# Patient Record
Sex: Female | Born: 1995 | Race: Black or African American | Hispanic: No | Marital: Single | Smoking: Never smoker
Health system: Southern US, Community
[De-identification: ages and names within clinical notes are randomized; demographics above are authoritative.]

---
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
## 2016-01-01 ENCOUNTER — Telehealth: Payer: Self-pay | Admitting: Behavioral Health

## 2016-01-01 NOTE — Telephone Encounter (Signed)
Unable to reach patient at time of Pre-Visit Call.  Left message for patient to return call when available.    

## 2016-01-04 ENCOUNTER — Other Ambulatory Visit: Payer: Self-pay | Admitting: Family

## 2016-01-04 ENCOUNTER — Telehealth: Payer: Self-pay | Admitting: Family

## 2016-01-04 ENCOUNTER — Encounter: Payer: Self-pay | Admitting: Family

## 2016-01-04 ENCOUNTER — Ambulatory Visit (INDEPENDENT_AMBULATORY_CARE_PROVIDER_SITE_OTHER): Payer: BLUE CROSS/BLUE SHIELD | Admitting: Family

## 2016-01-04 VITALS — BP 112/74 | HR 71 | Temp 98.0°F | Resp 16 | Ht 65.5 in | Wt 128.2 lb

## 2016-01-04 DIAGNOSIS — Z0000 Encounter for general adult medical examination without abnormal findings: Secondary | ICD-10-CM | POA: Diagnosis not present

## 2016-01-04 DIAGNOSIS — Z23 Encounter for immunization: Secondary | ICD-10-CM

## 2016-01-04 LAB — CBC WITH DIFFERENTIAL/PLATELET
Basophils Absolute: 0 10*3/uL (ref 0.0–0.1)
Basophils Relative: 0.4 % (ref 0.0–3.0)
Eosinophils Absolute: 0 10*3/uL (ref 0.0–0.7)
Eosinophils Relative: 0.4 % (ref 0.0–5.0)
HCT: 40 % (ref 36.0–49.0)
Hemoglobin: 13.2 g/dL (ref 12.0–16.0)
Lymphocytes Relative: 35.7 % (ref 24.0–48.0)
Lymphs Abs: 1.7 10*3/uL (ref 0.7–4.0)
MCHC: 33 g/dL (ref 31.0–37.0)
MCV: 92.7 fl (ref 78.0–98.0)
Monocytes Absolute: 0.3 10*3/uL (ref 0.1–1.0)
Monocytes Relative: 6.9 % (ref 3.0–12.0)
Neutro Abs: 2.7 10*3/uL (ref 1.4–7.7)
Neutrophils Relative %: 56.6 % (ref 43.0–71.0)
Platelets: 268 10*3/uL (ref 150.0–575.0)
RBC: 4.31 Mil/uL (ref 3.80–5.70)
RDW: 13.3 % (ref 11.4–15.5)
WBC: 4.8 10*3/uL (ref 4.5–13.5)

## 2016-01-04 LAB — BASIC METABOLIC PANEL
BUN: 14 mg/dL (ref 6–23)
CO2: 27 mEq/L (ref 19–32)
Calcium: 9.4 mg/dL (ref 8.4–10.5)
Chloride: 104 mEq/L (ref 96–112)
Creatinine, Ser: 0.66 mg/dL (ref 0.40–1.20)
GFR: 149.66 mL/min (ref >60.00)
Glucose, Bld: 81 mg/dL (ref 70–99)
Potassium: 4 mEq/L (ref 3.5–5.1)
Sodium: 136 mEq/L (ref 135–145)

## 2016-01-04 LAB — LIPID PANEL
Cholesterol: 112 mg/dL (ref 0–200)
HDL: 41.9 mg/dL (ref >39.00)
LDL Cholesterol: 62 mg/dL (ref 0–99)
NonHDL: 70.34
Total CHOL/HDL Ratio: 3
Triglycerides: 42 mg/dL (ref 0.0–149.0)
VLDL: 8.4 mg/dL (ref 0.0–40.0)

## 2016-01-04 MED ORDER — ETONOGESTREL 68 MG ~~LOC~~ IMPL: 1.0000 | DRUG_IMPLANT | Freq: Once | SUBCUTANEOUS | Status: AC

## 2016-01-04 NOTE — Addendum Note (Signed)
Addended by: Mervin Kung A on: 01/04/2016 06:19 PM   Modules accepted: Orders

## 2016-01-04 NOTE — Patient Instructions (Signed)
Please schedule follow up with GYN. Complete lab work prior to follow up. Schedule a nurse visit in 1 month for additional vaccines. Welcome to [facility]!

## 2016-01-04 NOTE — Telephone Encounter (Signed)
On follow up nurse visit- 1 month (around 02/01/16), patient will need the following vaccines:  Varicella #2 HPV #2 Menactra   On follow up Nurse visit in 6 months, around 07/03/16,  patient will need the following vaccines: Hep A # 2 HPV #3

## 2016-01-04 NOTE — Assessment & Plan Note (Signed)
Discussed healthy diet, exercise safe sex.  She is due for multiple vaccines.  Will split up of the next few visits. See phone note 01/04/16 for details.

## 2016-01-04 NOTE — Progress Notes (Signed)
Subjective:    Patient ID: Alisha Ramirez, female    DOB: 22-Oct-1996, 20 y.o.   MRN: [id number]  HPI  Alisha Ramirez is an 20 yr old female who presents today to establish care.  Immunizations: unsure Diet: diet is healthy Exercise: not exercising Pap Smear: to start at 21 Implanon, condoms placed 9/15- she is due for follow up with GYN.  LMP 1/12    Review of Systems  Constitutional: Negative for unexpected weight change.  HENT: Negative for hearing loss and rhinorrhea.   Eyes: Negative for visual disturbance.  Respiratory: Negative for cough and shortness of breath.   Cardiovascular: Negative for chest pain.  Gastrointestinal: Negative for diarrhea and constipation.  Genitourinary: Negative for dysuria, frequency and menstrual problem.  Musculoskeletal: Negative for myalgias and arthralgias.  Skin: Negative for rash.  Neurological: Negative for headaches.  Hematological: Negative for adenopathy.  Psychiatric/Behavioral:       Denies depression/anxiety   History reviewed. No pertinent past medical history.  Social History   Social History  . Marital Status: Single    Spouse Name: N/A  . Number of Children: N/A  . Years of Education: N/A   Occupational History  . Not on file.   Social History Main Topics  . Smoking status: Never Smoker   . Smokeless tobacco: Never Used  . Alcohol Use: No  . Drug Use: No  . Sexual Activity: Not on file   Other Topics Concern  . Not on file   Social History Narrative   Lives with family   Student- senior, plans to join the [employer].   2 brothers   1 sister   Enjoys work, spending time with friends   Works at [employer].     History reviewed. No pertinent past surgical history.  Family History  Problem Relation Age of Onset  . Hypertension Mother   . Cancer Neg Hx   . Diabetes Neg Hx   . Kidney disease Neg Hx     No Known Allergies  No current outpatient prescriptions on file prior to visit.   No current  facility-administered medications on file prior to visit.    BP 112/74 mmHg  Pulse 71  Temp(Src) 98 F (36.7 C) (Oral)  Resp 16  Ht 5' 5.5" (1.664 m)  Wt 128 lb 3.2 oz (58.151 kg)  BMI 21.00 kg/m2  SpO2 100%  LMP 12/10/2015       Objective:   Physical Exam  Physical Exam  Constitutional: She is oriented to person, place, and time. She appears well-developed and well-nourished. No distress.  HENT:  Head: Normocephalic and atraumatic.  Right Ear: Tympanic membrane and ear canal normal.  Left Ear: Tympanic membrane and ear canal normal.  Mouth/Throat: Oropharynx is clear and moist.  Eyes: Pupils are equal, round, and reactive to light. No scleral icterus.  Neck: Normal range of motion. No thyromegaly present.  Cardiovascular: Normal rate and regular rhythm.   No murmur heard. Pulmonary/Chest: Effort normal and breath sounds normal. No respiratory distress. He has no wheezes. She has no rales. She exhibits no tenderness.  Abdominal: Soft. Bowel sounds are normal. He exhibits no distension and no mass. There is no tenderness. There is no rebound and no guarding.  Musculoskeletal: She exhibits no edema.  Lymphadenopathy:    She has no cervical adenopathy.  Neurological: She is alert and oriented to person, place, and time. She has normal patellar reflexes. She exhibits normal muscle tone. Coordination normal.  Skin: Skin is  warm and dry.  Psychiatric: She has a normal mood and affect. Her behavior is normal. Judgment and thought content normal.  Breasts/pelvic: deferred to GYN       Assessment & Plan:         Assessment & Plan:

## 2016-01-04 NOTE — Progress Notes (Signed)
Pre visit review using our clinic review tool, if applicable. No additional management support is needed unless otherwise documented below in the visit note. 

## 2016-01-05 ENCOUNTER — Telehealth: Payer: Self-pay | Admitting: Family

## 2016-01-05 DIAGNOSIS — Z202 Contact with and (suspected) exposure to infections with a predominantly sexual mode of transmission: Secondary | ICD-10-CM

## 2016-01-05 LAB — GC/CHLAMYDIA PROBE AMP
CT PROBE, AMP APTIMA: DETECTED — AB
GC PROBE AMP APTIMA: NOT DETECTED

## 2016-01-05 NOTE — Telephone Encounter (Signed)
Please let pt know that Lab work + for chlamydia.  She needs to notify her sexual partners so that they can seek treatment as well.  I would like her to come to the office to receive 1gram of oral azithromycin in the office. Do we have in stock?  Otherwise, we can send rx to pharmacy for her to pick up and bring to office.   Remind patient on importance of condom use all of the time.  If she is agreeable, we should also add on or have her complete HIV screening as well.

## 2016-01-05 NOTE — Telephone Encounter (Signed)
Left message on cell# to return my call. See additional phone note. 

## 2016-01-05 NOTE — Telephone Encounter (Signed)
Left message on cell# to return my call. See additional phone note.

## 2016-01-06 ENCOUNTER — Ambulatory Visit: Payer: BLUE CROSS/BLUE SHIELD

## 2016-01-06 NOTE — Telephone Encounter (Signed)
Notified pt. Vaccine order sets completed and forwarded to PCP for signature then will be sent to triage room for upcoming appts.

## 2016-01-06 NOTE — Telephone Encounter (Signed)
Notified pt and scheduled nurse visit for below treatment on Friday at 9:30am. Pt will need to go to lab for HIV screen after treatment. Order has been entered.

## 2016-01-08 ENCOUNTER — Ambulatory Visit: Payer: BLUE CROSS/BLUE SHIELD

## 2016-01-08 ENCOUNTER — Ambulatory Visit (INDEPENDENT_AMBULATORY_CARE_PROVIDER_SITE_OTHER): Payer: BLUE CROSS/BLUE SHIELD | Admitting: Behavioral Health

## 2016-01-08 DIAGNOSIS — Z202 Contact with and (suspected) exposure to infections with a predominantly sexual mode of transmission: Secondary | ICD-10-CM

## 2016-01-08 DIAGNOSIS — A749 Chlamydial infection, unspecified: Secondary | ICD-10-CM | POA: Diagnosis not present

## 2016-01-08 MED ORDER — AZITHROMYCIN 500 MG PO TABS
1000.0000 mg | ORAL_TABLET | Freq: Once | ORAL | Status: AC
  Administered 2016-01-08: 1000 mg via ORAL

## 2016-01-08 NOTE — Progress Notes (Signed)
Pre visit review using our clinic review tool, if applicable. No additional management support is needed unless otherwise documented below in the visit note.  Patient presents in office today per telephone note 01/05/16. Administered 1 gram of oral Azithromycin to the patient. She tolerated the medication well.   Per O[place]ullivan: Advise the patient to inform her sexual partners, so they can be treated as well. Informed the patient of the provider's recommendation.  She understood instructions and did not have any further questions or concerns. After the nurse visit patient went to have a lab drawn.

## 2016-01-08 NOTE — Telephone Encounter (Signed)
Forms placed in triage room.

## 2016-02-03 ENCOUNTER — Ambulatory Visit: Payer: BLUE CROSS/BLUE SHIELD

## 2016-02-10 ENCOUNTER — Ambulatory Visit: Payer: BLUE CROSS/BLUE SHIELD

## 2016-02-16 ENCOUNTER — Ambulatory Visit (INDEPENDENT_AMBULATORY_CARE_PROVIDER_SITE_OTHER): Payer: BLUE CROSS/BLUE SHIELD | Admitting: Family

## 2016-02-16 ENCOUNTER — Encounter: Payer: Self-pay | Admitting: Family

## 2016-02-16 VITALS — BP 110/80 | HR 77 | Temp 97.9°F | Resp 18 | Ht 65.5 in | Wt 127.2 lb

## 2016-02-16 DIAGNOSIS — J069 Acute upper respiratory infection, unspecified: Secondary | ICD-10-CM

## 2016-02-16 DIAGNOSIS — B9789 Other viral agents as the cause of diseases classified elsewhere: Principal | ICD-10-CM

## 2016-02-16 DIAGNOSIS — M5489 Other dorsalgia: Secondary | ICD-10-CM

## 2016-02-16 DIAGNOSIS — J029 Acute pharyngitis, unspecified: Secondary | ICD-10-CM

## 2016-02-16 LAB — POCT RAPID STREP A (OFFICE): RAPID STREP A SCREEN: NEGATIVE

## 2016-02-16 MED ORDER — BENZONATATE 100 MG PO CAPS: 100.0000 mg | ORAL_CAPSULE | Freq: Three times a day (TID) | ORAL | Status: AC | PRN

## 2016-02-16 NOTE — Progress Notes (Signed)
Subjective:    Patient ID: Alisha Ramirez, female    DOB: Mar 19, 1996, 20 y.o.   MRN: [id number]  HPI  Pt presents today with chief complaint of cough. Cough began on Friday. Cough seems to be worsening.  Has had sick exposure (nephews). Cough is described as dry, denies associated sob.  + chills, no known fever. Did have sore throat initially but it has resolved.  Has associated HA and back aches- using ibuprofen.  Mild nasal drainage.  Feels "a little sick."  HA is off/on, mild.  HA is frontal.  + HA now.   Reports pain, "all over" her back.  Pain is non-radiating, no known injury, denies bowel/bladder incontinence.     Review of Systems    see HPI  No past medical history on file.  Social History   Social History  . Marital Status: Single    Spouse Name: N/A  . Number of Children: N/A  . Years of Education: N/A   Occupational History  . Not on file.   Social History Main Topics  . Smoking status: Never Smoker   . Smokeless tobacco: Never Used  . Alcohol Use: No  . Drug Use: No  . Sexual Activity: Not on file   Other Topics Concern  . Not on file   Social History Narrative   Lives with family   Student- senior, plans to join the air [employer].   2 brothers   1 sister   Enjoys work, spending time with friends   Works at [employer].     No past surgical history on file.  Family History  Problem Relation Age of Onset  . Hypertension Mother   . Cancer Neg Hx   . Diabetes Neg Hx   . Kidney disease Neg Hx     No Known Allergies  Current Outpatient Prescriptions on File Prior to Visit  Medication Sig Dispense Refill  . etonogestrel (IMPLANON) 68 MG IMPL implant 1 each (68 mg total) by Subdermal route once. 1 each 0   No current facility-administered medications on file prior to visit.    BP 110/80 mmHg  Pulse 77  Temp(Src) 97.9 F (36.6 C) (Oral)  Resp 18  Ht 5' 5.5" (1.664 m)  Wt 127 lb 3.2 oz (57.698 kg)  BMI 20.84 kg/m2  SpO2 99%    Objective:   Physical Exam  Constitutional: She is oriented to person, place, and time. She appears well-developed and well-nourished.  HENT:  Head: Normocephalic and atraumatic.  Right Ear: Tympanic membrane and ear canal normal.  Left Ear: Tympanic membrane and ear canal normal.  2-3+ cryptic tonsils, + erythema, no exudate  Cardiovascular: Normal rate, regular rhythm and normal heart sounds.   No murmur heard. Pulmonary/Chest: Effort normal and breath sounds normal. No respiratory distress. She has no wheezes.  Musculoskeletal:       Cervical back: She exhibits no tenderness.       Thoracic back: She exhibits normal range of motion.       Lumbar back: She exhibits no tenderness.  Lymphadenopathy:    She has cervical adenopathy.  Neurological: She is alert and oriented to person, place, and time.  Psychiatric: She has a normal mood and affect. Her behavior is normal. Judgment and thought content normal.          Assessment & Plan:  Back pain- non-radiating- trial of short course of ibuprofen.  Viral URI with cough- Rapid strep negative. Advised pt as follows:  You may  use tessalon as needed for cough, and ibuprofen as needed for back pain.  Call if you develop fever >101, if new/worsening symptoms, or if symptoms are not improved in 1 week.

## 2016-02-16 NOTE — Progress Notes (Signed)
Pre visit review using our clinic review tool, if applicable. No additional management support is needed unless otherwise documented below in the visit note. 

## 2016-02-16 NOTE — Addendum Note (Signed)
Addended by: Mervin KungFERGERSON, Suda Forbess A on: 02/16/2016 02:33 PM   Modules accepted: Orders

## 2016-02-16 NOTE — Patient Instructions (Signed)
You may use tessalon as needed for cough, and ibuprofen as needed for back pain.  Call if you develop fever >101, if new/worsening symptoms, or if symptoms are not improved in 1 week.

## 2016-07-05 ENCOUNTER — Ambulatory Visit: Payer: BLUE CROSS/BLUE SHIELD

## 2018-04-30 ENCOUNTER — Encounter: Payer: BLUE CROSS/BLUE SHIELD | Admitting: Family

## 2018-05-11 ENCOUNTER — Encounter: Payer: BLUE CROSS/BLUE SHIELD | Admitting: Family

## 2018-05-11 DIAGNOSIS — Z0289 Encounter for other administrative examinations: Secondary | ICD-10-CM

## 2018-05-21 ENCOUNTER — Encounter: Payer: Self-pay | Admitting: Family

## 2024-07-13 ENCOUNTER — Ambulatory Visit: Payer: Self-pay

## 2024-07-13 ENCOUNTER — Ambulatory Visit
Admission: EM | Admit: 2024-07-13 | Discharge: 2024-07-13 | Disposition: A | Discharge status: Home / Self Care | Payer: Self-pay | Attending: Family Medicine | Admitting: Family Medicine

## 2024-07-13 DIAGNOSIS — Z113 Encounter for screening for infections with a predominantly sexual mode of transmission: Secondary | ICD-10-CM | POA: Insufficient documentation

## 2024-07-13 NOTE — ED Provider Notes (Signed)
  [facility]  Note:  This document was prepared using Conservation officer, historic buildings and may include unintentional dictation errors.  MRN: [id number] DOB: 05-17-1996  Subjective:   Alisha Ramirez is a 28 y.o. female presenting for STI check. Denies fever, n/v, abdominal pain, pelvic pain, rashes, dysuria, urinary frequency, hematuria, vaginal discharge.  No known exposures.  Declines HIV and syphilis testing.  No current facility-administered medications for this encounter.  Current Outpatient Medications:    benzonatate (TESSALON) 100 MG capsule, Take 1 capsule (100 mg total) by mouth 3 (three) times daily as needed., Disp: 20 capsule, Rfl: 0   etonogestrel (IMPLANON) 68 MG IMPL implant, 1 each (68 mg total) by Subdermal route once., Disp: 1 each, Rfl: 0   No Known Allergies  History reviewed. No pertinent past medical history.   History reviewed. No pertinent surgical history.  Family History  Problem Relation Age of Onset   Hypertension Mother    Cancer Neg Hx    Diabetes Neg Hx    Kidney disease Neg Hx     Social History   Tobacco Use   Smoking status: Never   Smokeless tobacco: Never  Substance Use Topics   Alcohol use: No    Alcohol/week: 0.0 standard drinks of alcohol   Drug use: No    ROS   Objective:   Vitals: BP (!) 134/94 (BP Location: Right Arm)   Pulse 79   Temp 98.6 F (37 C) (Oral)   Resp 14   SpO2 98%   Physical Exam Constitutional:      General: She is not in acute distress.    Appearance: Normal appearance. She is well-developed. She is not ill-appearing, toxic-appearing or diaphoretic.  HENT:     Head: Normocephalic and atraumatic.     Nose: Nose normal.     Mouth/Throat:     Mouth: Mucous membranes are moist.     Pharynx: Oropharynx is clear.  Eyes:     General: No scleral icterus.       Right eye: No discharge.        Left eye: No discharge.     Extraocular Movements: Extraocular movements intact.      Conjunctiva/sclera: Conjunctivae normal.  Cardiovascular:     Rate and Rhythm: Normal rate.  Pulmonary:     Effort: Pulmonary effort is normal.  Abdominal:     General: Bowel sounds are normal. There is no distension.     Palpations: Abdomen is soft. There is no mass.     Tenderness: There is no abdominal tenderness. There is no right CVA tenderness, left CVA tenderness, guarding or rebound.  Skin:    General: Skin is warm and dry.  Neurological:     General: No focal deficit present.     Mental Status: She is alert and oriented to person, place, and time.  Psychiatric:        Mood and Affect: Mood normal.        Behavior: Behavior normal.        Thought Content: Thought content normal.        Judgment: Judgment normal.     Assessment and Plan :   PDMP not reviewed this encounter.  1. Screen for STD (sexually transmitted disease)    STI check pending.   Christopher Savannah, PA-C 07/13/24 1105

## 2024-07-13 NOTE — ED Triage Notes (Signed)
 Pt requested STD's test.

## 2024-07-15 ENCOUNTER — Ambulatory Visit (HOSPITAL_COMMUNITY): Payer: Self-pay

## 2024-07-15 LAB — CERVICOVAGINAL ANCILLARY ONLY
Chlamydia: NEGATIVE
Comment: NEGATIVE
Comment: NEGATIVE
Comment: NORMAL
Neisseria Gonorrhea: NEGATIVE
Trichomonas: POSITIVE — AB

## 2024-07-15 MED ORDER — METRONIDAZOLE 500 MG PO TABS: 500.0000 mg | ORAL_TABLET | Freq: Two times a day (BID) | ORAL | 0 refills | Status: AC
